# Patient Record
Sex: Male | Born: 1970 | Race: White | Hispanic: Yes | Marital: Married | Smoking: Never smoker
Health system: Southern US, Community
[De-identification: ages and names within clinical notes are randomized; demographics above are authoritative.]

---

## 2006-02-22 ENCOUNTER — Emergency Department (HOSPITAL_COMMUNITY): Admission: EM | Admit: 2006-02-22 | Discharge: 2006-02-22 | Payer: Self-pay | Admitting: Emergency Medicine

## 2007-07-27 ENCOUNTER — Encounter: Payer: Self-pay | Admitting: Internal Medicine

## 2008-06-19 ENCOUNTER — Ambulatory Visit: Payer: Self-pay | Admitting: Internal Medicine

## 2008-06-19 ENCOUNTER — Encounter: Payer: Self-pay | Admitting: Internal Medicine

## 2008-06-19 DIAGNOSIS — B351 Tinea unguium: Secondary | ICD-10-CM

## 2008-06-19 DIAGNOSIS — M545 Low back pain: Secondary | ICD-10-CM

## 2008-06-19 DIAGNOSIS — R61 Generalized hyperhidrosis: Secondary | ICD-10-CM | POA: Insufficient documentation

## 2008-06-30 ENCOUNTER — Encounter: Admission: RE | Admit: 2008-06-30 | Discharge: 2008-09-25 | Payer: Self-pay | Admitting: Internal Medicine

## 2008-07-01 ENCOUNTER — Encounter: Payer: Self-pay | Admitting: Internal Medicine

## 2008-07-01 ENCOUNTER — Ambulatory Visit: Payer: Self-pay | Admitting: Internal Medicine

## 2008-07-01 LAB — CONVERTED CEMR LAB
Albumin: 4.8 g/dL (ref 3.5–5.2)
BUN: 10 mg/dL (ref 6–23)
CO2: 25 meq/L (ref 19–32)
Cholesterol: 198 mg/dL (ref 0–200)
Eosinophils Absolute: 0.2 10*3/uL (ref 0.0–0.7)
Eosinophils Relative: 3 % (ref 0–5)
Glucose, Bld: 95 mg/dL (ref 70–99)
HCT: 44.9 % (ref 39.0–52.0)
HDL: 48 mg/dL (ref >39)
Lymphocytes Relative: 34 % (ref 12–46)
Lymphs Abs: 2.4 10*3/uL (ref 0.7–4.0)
MCV: 87.4 fL (ref 78.0–100.0)
Monocytes Relative: 6 % (ref 3–12)
Neutrophils Relative %: 56 % (ref 43–77)
Platelets: 256 10*3/uL (ref 150–400)
Potassium: 4.8 meq/L (ref 3.5–5.3)
RBC: 5.14 M/uL (ref 4.22–5.81)
Sodium: 139 meq/L (ref 135–145)
Total Protein: 7.4 g/dL (ref 6.0–8.3)
Triglycerides: 126 mg/dL (ref <150)
WBC: 7 10*3/uL (ref 4.0–10.5)

## 2008-07-17 ENCOUNTER — Encounter: Payer: Self-pay | Admitting: Internal Medicine

## 2008-08-15 ENCOUNTER — Ambulatory Visit (HOSPITAL_COMMUNITY): Admission: RE | Admit: 2008-08-15 | Discharge: 2008-08-15 | Payer: Self-pay | Admitting: Infectious Diseases

## 2008-08-15 ENCOUNTER — Ambulatory Visit: Payer: Self-pay | Admitting: Infectious Diseases

## 2008-08-15 DIAGNOSIS — Z9181 History of falling: Secondary | ICD-10-CM | POA: Insufficient documentation

## 2008-10-15 ENCOUNTER — Encounter: Payer: Self-pay | Admitting: Internal Medicine

## 2008-10-22 ENCOUNTER — Ambulatory Visit: Payer: Self-pay | Admitting: Internal Medicine

## 2008-10-28 ENCOUNTER — Encounter: Payer: Self-pay | Admitting: Internal Medicine

## 2008-10-28 ENCOUNTER — Encounter: Admission: RE | Admit: 2008-10-28 | Discharge: 2008-12-31 | Payer: Self-pay | Admitting: Internal Medicine

## 2009-01-02 IMAGING — CR DG SACRUM/COCCYX 2+V
4 series · 4 of 4 positions shown · non-contrast
Comparison: None

CLINICAL DATA: Coccyx fracture.

SACRUM AND COCCYX - 2+ VIEW

[t sacrum a.p.]
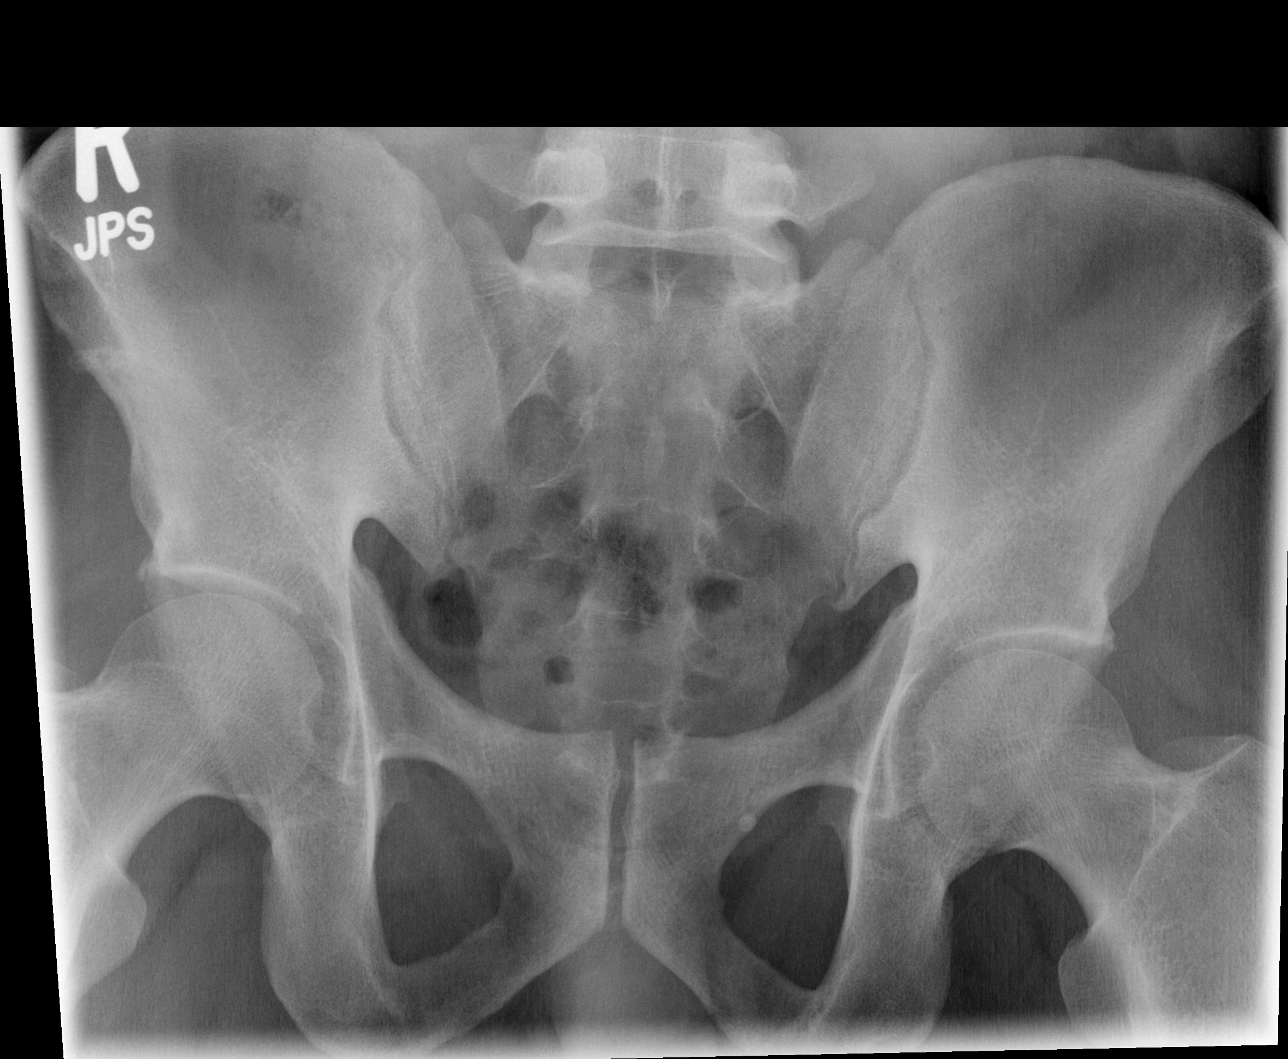

[t coccyx a.p.]
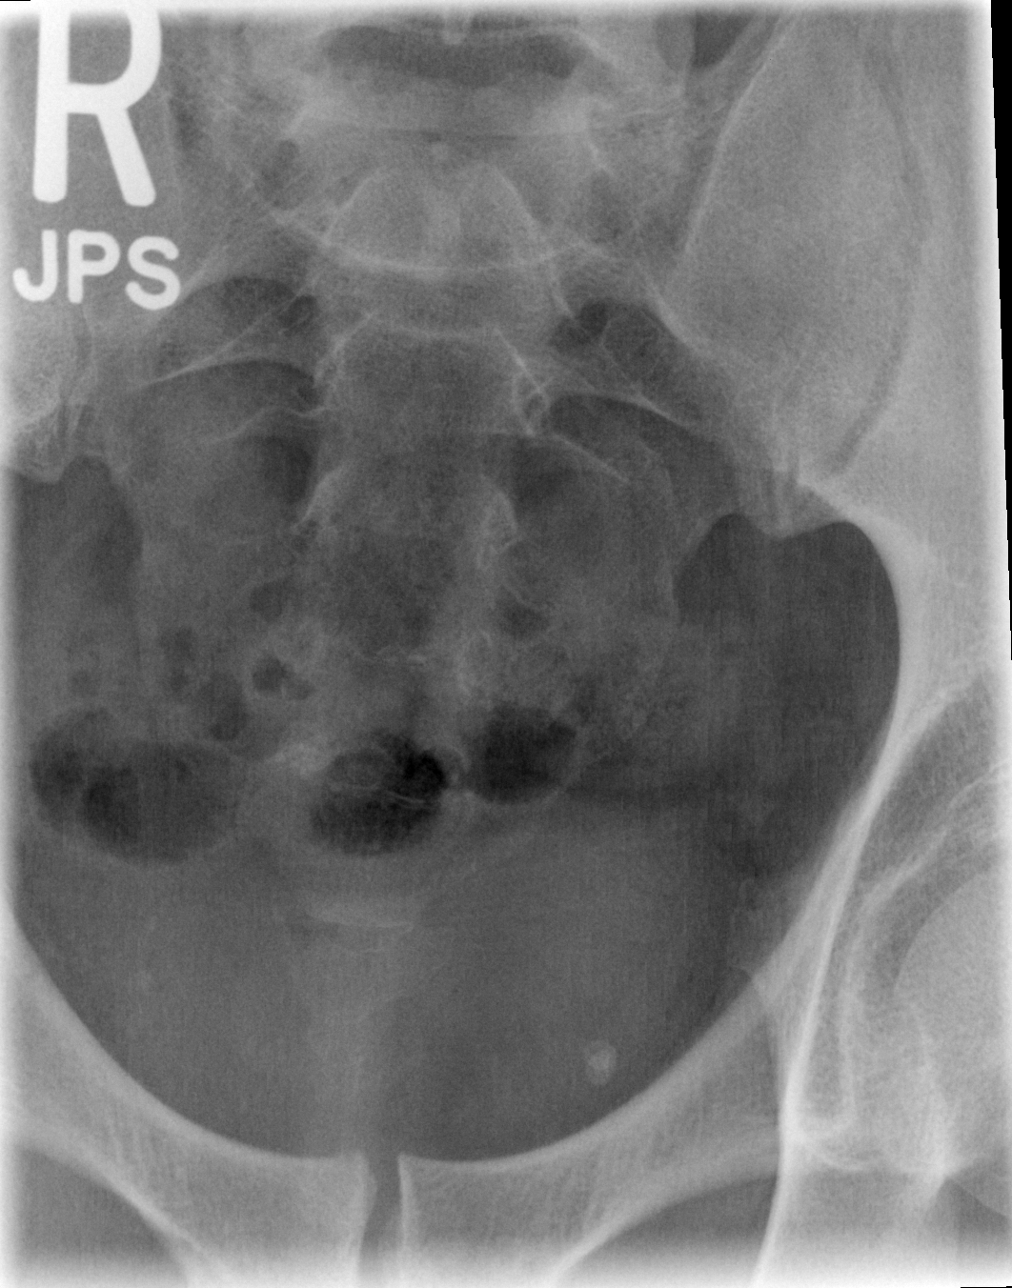

[t sacrum lat *]
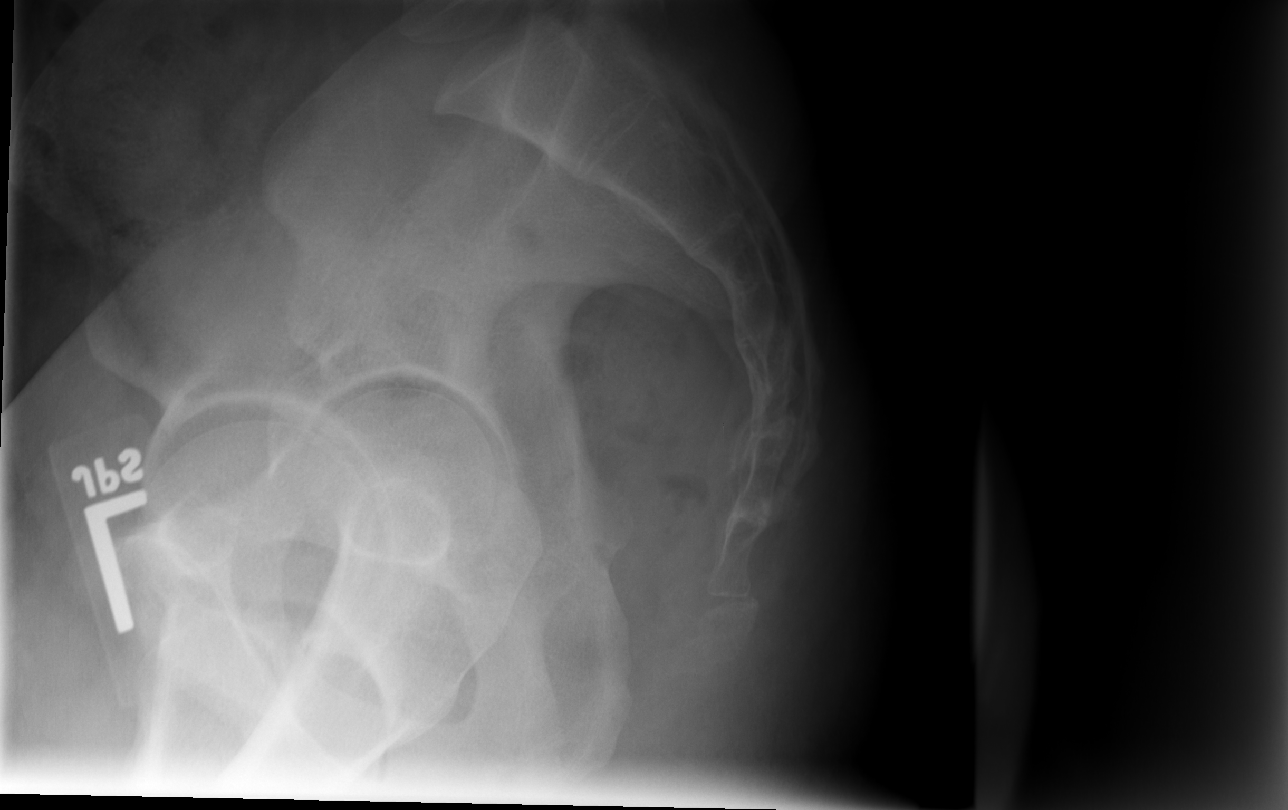

[t sacrum lat]
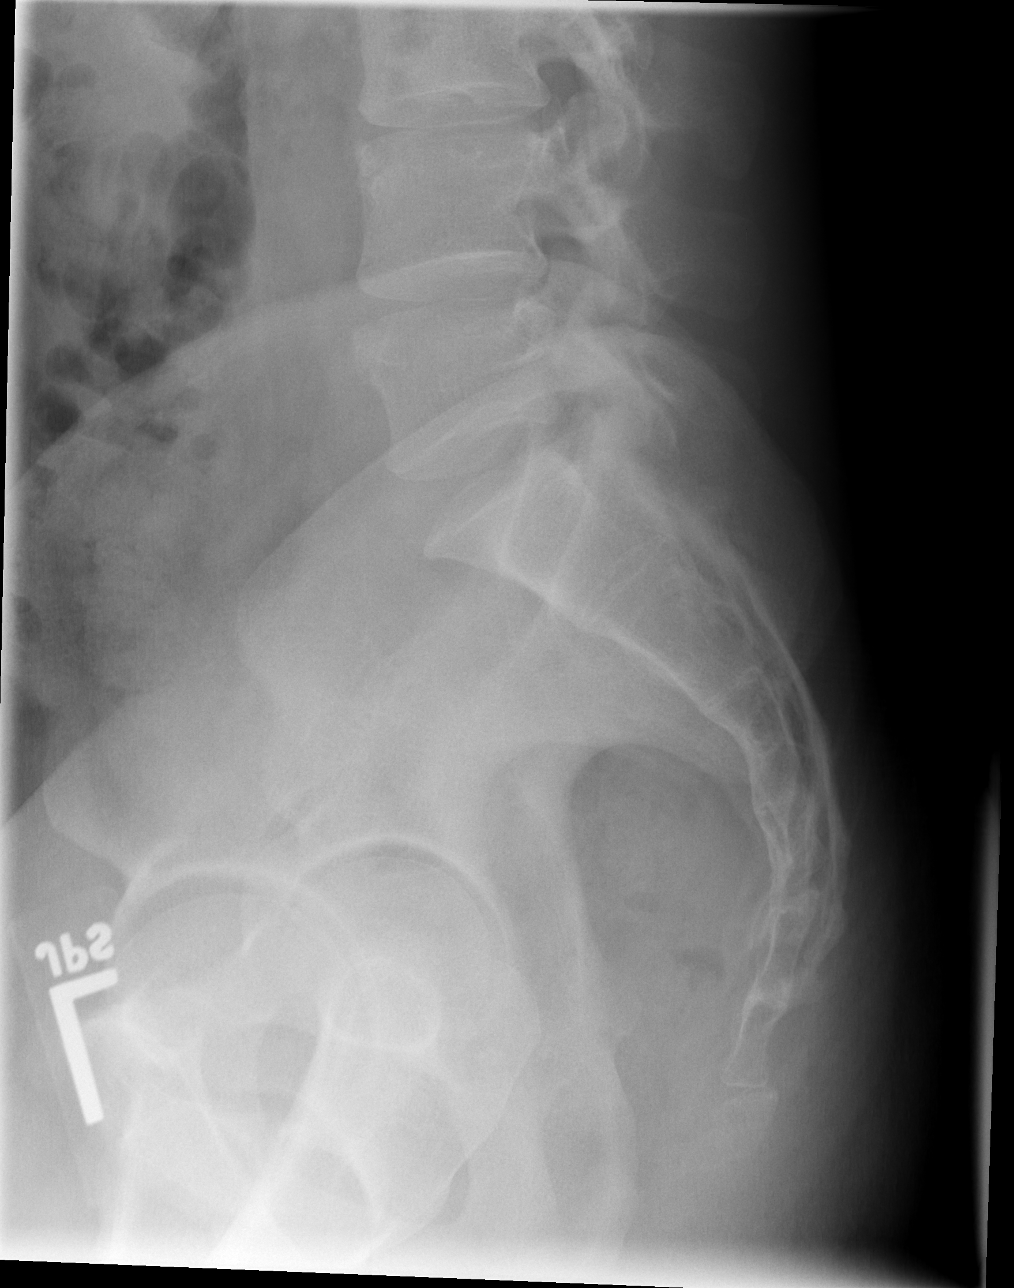

[4 of 4 positions shown; findings below may reference images not displayed]

FINDINGS: Sacrum and coccyx appear within normal limits.  No
displaced sacral fracture is identified.  Please note that there is
a wide amount of anatomic variation at the sacrococcygeal junction.
Plain films have limited sensitivity and specificity in evaluation
of the sacrococcygeal junction and little if any utility in
evaluating the coccyx.
IMPRESSION: No displaced sacral coccygeal fracture identified.

## 2009-12-08 ENCOUNTER — Encounter: Payer: Self-pay | Admitting: Internal Medicine

## 2010-10-26 NOTE — Miscellaneous (Signed)
Summary: [facility]  [facility]   Imported By: Gentry Fitz 03/23/2010 14:49:12  _____________________________________________________________________  External Attachment:    Type:   Image     Comment:   External Document

## 2012-03-26 ENCOUNTER — Encounter (HOSPITAL_COMMUNITY): Payer: Self-pay | Admitting: Emergency Medicine

## 2012-03-26 ENCOUNTER — Emergency Department (HOSPITAL_COMMUNITY)
Admission: EM | Admit: 2012-03-26 | Discharge: 2012-03-26 | Disposition: A | Discharge status: Home / Self Care | Payer: 59 | Source: Home / Self Care | Attending: Emergency Medicine | Admitting: Emergency Medicine

## 2012-03-26 DIAGNOSIS — K5289 Other specified noninfective gastroenteritis and colitis: Secondary | ICD-10-CM

## 2012-03-26 DIAGNOSIS — K529 Noninfective gastroenteritis and colitis, unspecified: Secondary | ICD-10-CM

## 2012-03-26 DIAGNOSIS — R110 Nausea: Secondary | ICD-10-CM

## 2012-03-26 MED ORDER — ONDANSETRON HCL 4 MG PO TABS: 4.0000 mg | ORAL_TABLET | Freq: Three times a day (TID) | ORAL | Status: AC | PRN

## 2012-03-26 MED ORDER — DIPHENOXYLATE-ATROPINE 2.5-0.025 MG PO TABS: 1.0000 | ORAL_TABLET | Freq: Four times a day (QID) | ORAL | Status: AC | PRN

## 2012-03-26 NOTE — ED Provider Notes (Signed)
History     CSN: [id number]  Arrival date & time 03/26/12  1218   First MD Initiated Contact with Patient 03/26/12 1219      Chief Complaint  Patient presents with  . GI Problem    (Consider location/radiation/quality/duration/timing/severity/associated sxs/prior treatment) HPI Comments: Patient presents to urgent care complaining of abdominal cramping and diarrheas after he ate at [employer] about 2 days. Feeling nauseous but not vomiting. Have had about 4-6 diarrheas liquidy. Patient denies any fevers, shortness of breath chest pains or any respiratory symptoms. Feels just like not eating anything because of his nausea.  Patient is a 41 y.o. male presenting with GI illness. The history is provided by the patient.  GI Problem  This is a new problem. The current episode started yesterday. The problem occurs 2 to 4 times per day. The problem has not changed since onset.The stool consistency is described as watery. There has been no fever. Associated symptoms include abdominal pain and vomiting. Pertinent negatives include no chills, no arthralgias, no URI and no cough. He has tried nothing for the symptoms. His past medical history does not include irritable bowel syndrome, inflammatory bowel disease, short gut syndrome, bowel resection, recent abdominal surgery or malabsorption.    History reviewed. No pertinent past medical history.  History reviewed. No pertinent past surgical history.  No family history on file.  History  Substance Use Topics  . Smoking status: Never Smoker   . Smokeless tobacco: Not on file  . Alcohol Use: No      Review of Systems  Constitutional: Positive for activity change and appetite change. Negative for fever, chills, diaphoresis, fatigue and unexpected weight change.  Respiratory: Negative for cough, shortness of breath, wheezing and stridor.   Cardiovascular: Negative for chest pain and leg swelling.  Gastrointestinal: Positive for nausea, vomiting,  abdominal pain and diarrhea. Negative for constipation, blood in stool, abdominal distention, anal bleeding and rectal pain.  Genitourinary: Negative for dysuria, discharge and penile pain.  Musculoskeletal: Negative for arthralgias.  Skin: Negative for rash and wound.  Neurological: Negative for dizziness.  Psychiatric/Behavioral: Negative for decreased concentration.    Allergies  Review of patient's allergies indicates no known allergies.  Home Medications   Current Outpatient Rx  Name Route Sig Dispense Refill  . DIPHENOXYLATE-ATROPINE 2.5-0.025 MG PO TABS Oral Take 1 tablet by mouth 4 (four) times daily as needed for diarrhea or loose stools. 10 tablet 0  . ONDANSETRON HCL 4 MG PO TABS Oral Take 1 tablet (4 mg total) by mouth every 8 (eight) hours as needed for nausea. 20 tablet 0    BP 101/63  Pulse 50  Temp 97.7 F (36.5 C) (Oral)  Resp 14  SpO2 100%  Physical Exam  Nursing note and vitals reviewed. Constitutional: Vital signs are normal. He appears well-developed and well-nourished. He is active.  Non-toxic appearance. He does not have a sickly appearance. He does not appear ill. No distress.  HENT:  Head: Normocephalic.  Mouth/Throat: No oropharyngeal exudate.  Eyes: Conjunctivae are normal. Right eye exhibits no discharge. Left eye exhibits no discharge. No scleral icterus.  Neck: Neck supple.  Pulmonary/Chest: Effort normal.  Abdominal: Soft. Normal appearance. He exhibits no distension and no mass. There is no hepatosplenomegaly, splenomegaly or hepatomegaly. There is no tenderness. There is no rigidity, no rebound, no guarding and no CVA tenderness. No hernia. Hernia confirmed negative in the ventral area.  Neurological: He is alert.  Skin: No rash noted.    ED  Course  Procedures (including critical care time)  Labs Reviewed - No data to display No results found.   1. Nausea   2. Gastroenteritis       MDM  Patient with sudden onset of vomiting  diarrhea and abdominal colicky-type pain. Tolerating oral fluids afebrile with a normal abdominal exam. Possibly gastroenteritis or food poisoning. Patient looks well hydrated and comfortable symptomatic management encouraged with diet modifications for the next 48-72 hours. Patient was instructed about symptoms of what were further evaluation in the emergency department. Occurs to return if diarrheas persist beyond 5-7 days or new symptoms. Patient agree with treatment plan and followup care as necessary.        Jimmie Molly, MD 03/26/12 [phone]

## 2012-03-26 NOTE — Discharge Instructions (Signed)
  Return if worsening symptoms, fevers vomiting or pain changes location and character as discussed    B.R.A.T. Diet Your doctor has recommended the B.R.A.T. diet for you or your child until the condition improves. This is often used to help control diarrhea and vomiting symptoms. If you or your child can tolerate clear liquids, you may have:  Bananas.   Rice.   Applesauce.   Toast (and other simple starches such as crackers, potatoes, noodles).  Be sure to avoid dairy products, meats, and fatty foods until symptoms are better. Fruit juices such as apple, grape, and prune juice can make diarrhea worse. Avoid these. Continue this diet for 2 days or as instructed by your caregiver. Document Released: 09/12/2005 Document Revised: 09/01/2011 Document Reviewed: 03/01/2007 [facility] Patient Information 2012 [place], [place].

## 2012-03-26 NOTE — ED Notes (Signed)
Pt here with c/o abdominal cramping and diarrhea after eating yogurt @ [employer] x 2 dys ago.denies vomiting according to spanish interpretor Dr.COLL.STATES HE FEELS BETTER BUT HAS SOME NAUSEA.TRIED FLUIDS AND ADVIL

## 2013-11-30 ENCOUNTER — Emergency Department (INDEPENDENT_AMBULATORY_CARE_PROVIDER_SITE_OTHER)
Admission: EM | Admit: 2013-11-30 | Discharge: 2013-11-30 | Disposition: A | Discharge status: Home / Self Care | Payer: 59 | Source: Home / Self Care | Attending: Family Medicine | Admitting: Family Medicine

## 2013-11-30 ENCOUNTER — Encounter (HOSPITAL_COMMUNITY): Payer: Self-pay | Admitting: Emergency Medicine

## 2013-11-30 DIAGNOSIS — L259 Unspecified contact dermatitis, unspecified cause: Secondary | ICD-10-CM

## 2013-11-30 MED ORDER — BETAMETHASONE DIPROPIONATE AUG 0.05 % EX CREA: TOPICAL_CREAM | Freq: Two times a day (BID) | CUTANEOUS | Status: AC

## 2013-11-30 NOTE — ED Notes (Signed)
Right upper arm red, swollen, and painful.  Initially saw pimples like "poison oak" itched, scratched area ,  Noticed bumps 2 weeks ago.  applied triple antibiotic ointment 2 days ago.

## 2013-11-30 NOTE — Discharge Instructions (Signed)
You have had an allergic reaction to a probable insect bite as well as possibly the antibiotic cream you put on it. Do not use anymore of the cream. Start the Diprolene Cream tonight if possible. Warm soaks for 20 to 30 minutes prior to application of cream and then 2 to 3 times a day. If the area appears to be worsening in 48 hours return here for recheck.   Contact Dermatitis Contact dermatitis is a reaction to certain substances that touch the skin. Contact dermatitis can be either irritant contact dermatitis or allergic contact dermatitis. Irritant contact dermatitis does not require previous exposure to the substance for a reaction to occur.Allergic contact dermatitis only occurs if you have been exposed to the substance before. Upon a repeat exposure, your body reacts to the substance.  CAUSES  Many substances can cause contact dermatitis. Irritant dermatitis is most commonly caused by repeated exposure to mildly irritating substances, such as:  Makeup.  Soaps.  Detergents.  Bleaches.  Acids.  Metal salts, such as nickel. Allergic contact dermatitis is most commonly caused by exposure to:  Poisonous plants.  Chemicals (deodorants, shampoos).  Jewelry.  Latex.  Neomycin in triple antibiotic cream.  Preservatives in products, including clothing. SYMPTOMS  The area of skin that is exposed may develop:  Dryness or flaking.  Redness.  Cracks.  Itching.  Pain or a burning sensation.  Blisters. With allergic contact dermatitis, there may also be swelling in areas such as the eyelids, mouth, or genitals.  DIAGNOSIS  Your caregiver can usually tell what the problem is by doing a physical exam. In cases where the cause is uncertain and an allergic contact dermatitis is suspected, a patch skin test may be performed to help determine the cause of your dermatitis. TREATMENT Treatment includes protecting the skin from further contact with the irritating substance by  avoiding that substance if possible. Barrier creams, powders, and gloves may be helpful. Your caregiver may also recommend:  Steroid creams or ointments applied 2 times daily. For best results, soak the rash area in cool water for 20 minutes. Then apply the medicine. Cover the area with a plastic wrap. You can store the steroid cream in the refrigerator for a "chilly" effect on your rash. That may decrease itching. Oral steroid medicines may be needed in more severe cases.  Antibiotics or antibacterial ointments if a skin infection is present.  Antihistamine lotion or an antihistamine taken by mouth to ease itching.  Lubricants to keep moisture in your skin.  Burow's solution to reduce redness and soreness or to dry a weeping rash. Mix one packet or tablet of solution in 2 cups cool water. Dip a clean washcloth in the mixture, wring it out a bit, and put it on the affected area. Leave the cloth in place for 30 minutes. Do this as often as possible throughout the day.  Taking several cornstarch or baking soda baths daily if the area is too large to cover with a washcloth. Harsh chemicals, such as alkalis or acids, can cause skin damage that is like a burn. You should flush your skin for 15 to 20 minutes with cold water after such an exposure. You should also seek immediate medical care after exposure. Bandages (dressings), antibiotics, and pain medicine may be needed for severely irritated skin.  HOME CARE INSTRUCTIONS  Avoid the substance that caused your reaction.  Keep the area of skin that is affected away from hot water, soap, sunlight, chemicals, acidic substances, or anything else  that would irritate your skin.  Do not scratch the rash. Scratching may cause the rash to become infected.  You may take cool baths to help stop the itching.  Only take over-the-counter or prescription medicines as directed by your caregiver.  See your caregiver for follow-up care as directed to make sure  your skin is healing properly. SEEK MEDICAL CARE IF:   Your condition is not better after 3 days of treatment.  You seem to be getting worse.  You see signs of infection such as swelling, tenderness, redness, soreness, or warmth in the affected area.  You have any problems related to your medicines. Document Released: 09/09/2000 Document Revised: 12/05/2011 Document Reviewed: 02/15/2011 [facility] Patient Information 2014 [place].

## 2013-11-30 NOTE — ED Provider Notes (Signed)
CSN: [id number]     Arrival date & time 11/30/13  1731 History   First MD Initiated Contact with Patient 11/30/13 1853     Chief Complaint  Patient presents with  . Rash   HPI: Patient is a 43 y.o. male presenting with rash. The history is provided by the patient.  Rash Location:  Shoulder/arm Shoulder/arm rash location:  R upper arm Quality: painful, redness and swelling   Pain details:    Onset quality:  Gradual Pt reports that approx 1 week ago he noticed several small "water blisters" to his (R) upper arm. He scratched them as they began to itch. Several days ago he began to notice the area was becoming red and swollen. He used a topical antibiotic ointment on the area and within 24 hours he noted significant rash, redness and swelling to the area that then became very painful. Denies fever.   History reviewed. No pertinent past medical history. History reviewed. No pertinent past surgical history. No family history on file. History  Substance Use Topics  . Smoking status: Never Smoker   . Smokeless tobacco: Not on file  . Alcohol Use: No    Review of Systems  Skin: Positive for rash.  All other systems reviewed and are negative.    Allergies  Review of patient's allergies indicates no known allergies.  Home Medications   Current Outpatient Rx  Name  Route  Sig  Dispense  Refill  . augmented betamethasone dipropionate (DIPROLENE AF) 0.05 % cream   Topical   Apply topically 2 (two) times daily.   15 g   0    BP 132/59  Pulse 57  Temp(Src) 98.3 F (36.8 C) (Oral)  Resp 18  SpO2 97% Physical Exam  Constitutional: He is oriented to person, place, and time. He appears well-developed and well-nourished.  HENT:  Head: Normocephalic and atraumatic.  Eyes: Conjunctivae are normal.  Cardiovascular: Normal rate.   Pulmonary/Chest: Effort normal.  Neurological: He is alert and oriented to person, place, and time.  Skin: Skin is warm and dry. Rash noted.  Large area  (approx 8 x 4 cm) to posterior RUE from mid deltoid region to just above (R) elbow/ac space. Erythematous, confluent rash that is slightly raised and warm to touch.There are no open draining areas. There is a bluish colored center which could represent site of an initial insect bite. Slightly warm and TTP.  Psychiatric: He has a normal mood and affect.    ED Course  Procedures (including critical care time) Labs Review Labs Reviewed - No data to display Imaging Review No results found.   MDM   1. Contact dermatitis    PE c/w a contact dermatitis s/p possible insect bite, exacerbated by likely reaction to neomycin based antibiotic ointment. Pt instructed to stop using antibiotic ointment ointment and start using Diprolene Topical Cream. Pt encouraged to return in 48 if not improving or worsening. Discussed pt w/ Dr Artis FlockKindl who also examined pt's rash. Pt verbalizes understanding and is agreement w/ plan.    Leanne ChangKatherine P Evagelia Knack, NP 12/01/13 [phone]

## 2013-12-02 NOTE — ED Provider Notes (Signed)
Medical screening examination/treatment/procedure(s) were performed by resident physician or non-physician practitioner and as supervising physician I was immediately available for consultation/collaboration.   KINDL,JAMES DOUGLAS MD.   James D Kindl, MD 12/02/13 2032 

## 2017-03-13 DIAGNOSIS — H40023 Open angle with borderline findings, high risk, bilateral: Secondary | ICD-10-CM | POA: Diagnosis not present

## 2017-04-04 DIAGNOSIS — Z0000 Encounter for general adult medical examination without abnormal findings: Secondary | ICD-10-CM | POA: Diagnosis not present

## 2018-04-06 DIAGNOSIS — E785 Hyperlipidemia, unspecified: Secondary | ICD-10-CM | POA: Diagnosis not present

## 2018-04-06 DIAGNOSIS — Z0000 Encounter for general adult medical examination without abnormal findings: Secondary | ICD-10-CM | POA: Diagnosis not present

## 2018-05-18 DIAGNOSIS — B351 Tinea unguium: Secondary | ICD-10-CM | POA: Diagnosis not present

## 2018-05-18 DIAGNOSIS — L609 Nail disorder, unspecified: Secondary | ICD-10-CM | POA: Diagnosis not present

## 2018-05-18 DIAGNOSIS — Z79899 Other long term (current) drug therapy: Secondary | ICD-10-CM | POA: Diagnosis not present

## 2018-06-04 DIAGNOSIS — L74512 Primary focal hyperhidrosis, palms: Secondary | ICD-10-CM | POA: Diagnosis not present

## 2018-10-23 DIAGNOSIS — L74512 Primary focal hyperhidrosis, palms: Secondary | ICD-10-CM | POA: Diagnosis not present

## 2019-06-14 ENCOUNTER — Ambulatory Visit
Admission: RE | Admit: 2019-06-14 | Discharge: 2019-06-14 | Disposition: A | Discharge status: Home / Self Care | Payer: 59 | Source: Ambulatory Visit | Attending: Internal Medicine | Admitting: Internal Medicine

## 2019-06-14 ENCOUNTER — Other Ambulatory Visit: Payer: Self-pay | Admitting: Internal Medicine

## 2019-06-14 DIAGNOSIS — M545 Low back pain, unspecified: Secondary | ICD-10-CM

## 2019-08-01 ENCOUNTER — Other Ambulatory Visit: Payer: Self-pay

## 2019-08-01 ENCOUNTER — Other Ambulatory Visit: Payer: Self-pay | Admitting: Nurse Practitioner

## 2019-08-01 ENCOUNTER — Ambulatory Visit
Admission: RE | Admit: 2019-08-01 | Discharge: 2019-08-01 | Disposition: A | Discharge status: Home / Self Care | Payer: No Typology Code available for payment source | Source: Ambulatory Visit | Attending: Nurse Practitioner | Admitting: Nurse Practitioner

## 2019-08-01 DIAGNOSIS — Z021 Encounter for pre-employment examination: Secondary | ICD-10-CM
